# Patient Record
Sex: Male | Born: 1955 | Race: White | Hispanic: No | Marital: Married | Smoking: Never smoker
Health system: Southern US, Community
[De-identification: ages and names within clinical notes are randomized; demographics above are authoritative.]

## PROBLEM LIST (undated history)

## (undated) DIAGNOSIS — G4730 Sleep apnea, unspecified: Secondary | ICD-10-CM

## (undated) DIAGNOSIS — N200 Calculus of kidney: Secondary | ICD-10-CM

## (undated) DIAGNOSIS — E7800 Pure hypercholesterolemia, unspecified: Secondary | ICD-10-CM

## (undated) DIAGNOSIS — I10 Essential (primary) hypertension: Secondary | ICD-10-CM

## (undated) HISTORY — PX: HERNIA REPAIR: SHX51

## (undated) HISTORY — PX: TONSILLECTOMY: SUR1361

## (undated) HISTORY — PX: KIDNEY STONE SURGERY: SHX686

## (undated) HISTORY — PX: CHOLECYSTECTOMY: SHX55

---

## 2008-12-08 ENCOUNTER — Ambulatory Visit: Payer: Self-pay | Admitting: Family Medicine

## 2008-12-08 DIAGNOSIS — Z87442 Personal history of urinary calculi: Secondary | ICD-10-CM | POA: Insufficient documentation

## 2008-12-08 DIAGNOSIS — L255 Unspecified contact dermatitis due to plants, except food: Secondary | ICD-10-CM | POA: Insufficient documentation

## 2008-12-08 DIAGNOSIS — E785 Hyperlipidemia, unspecified: Secondary | ICD-10-CM | POA: Insufficient documentation

## 2008-12-08 DIAGNOSIS — I10 Essential (primary) hypertension: Secondary | ICD-10-CM | POA: Insufficient documentation

## 2010-11-25 ENCOUNTER — Encounter: Payer: Self-pay | Admitting: Emergency Medicine

## 2010-11-25 ENCOUNTER — Emergency Department (HOSPITAL_COMMUNITY): Payer: Worker's Compensation

## 2010-11-25 ENCOUNTER — Other Ambulatory Visit: Payer: Self-pay

## 2010-11-25 ENCOUNTER — Observation Stay (HOSPITAL_COMMUNITY)
Admission: EM | Admit: 2010-11-25 | Discharge: 2010-11-26 | Disposition: A | Discharge status: Home / Self Care | Payer: Worker's Compensation | Attending: Internal Medicine | Admitting: Internal Medicine

## 2010-11-25 DIAGNOSIS — E785 Hyperlipidemia, unspecified: Secondary | ICD-10-CM | POA: Diagnosis present

## 2010-11-25 DIAGNOSIS — I10 Essential (primary) hypertension: Secondary | ICD-10-CM | POA: Diagnosis present

## 2010-11-25 DIAGNOSIS — Z8249 Family history of ischemic heart disease and other diseases of the circulatory system: Secondary | ICD-10-CM | POA: Insufficient documentation

## 2010-11-25 DIAGNOSIS — K219 Gastro-esophageal reflux disease without esophagitis: Secondary | ICD-10-CM | POA: Insufficient documentation

## 2010-11-25 DIAGNOSIS — R0602 Shortness of breath: Secondary | ICD-10-CM | POA: Insufficient documentation

## 2010-11-25 DIAGNOSIS — Z79899 Other long term (current) drug therapy: Secondary | ICD-10-CM | POA: Insufficient documentation

## 2010-11-25 DIAGNOSIS — G4733 Obstructive sleep apnea (adult) (pediatric): Secondary | ICD-10-CM | POA: Diagnosis present

## 2010-11-25 DIAGNOSIS — R079 Chest pain, unspecified: Principal | ICD-10-CM | POA: Diagnosis present

## 2010-11-25 HISTORY — DX: Pure hypercholesterolemia, unspecified: E78.00

## 2010-11-25 HISTORY — DX: Calculus of kidney: N20.0

## 2010-11-25 HISTORY — DX: Sleep apnea, unspecified: G47.30

## 2010-11-25 HISTORY — DX: Essential (primary) hypertension: I10

## 2010-11-25 LAB — CK TOTAL AND CKMB (NOT AT ARMC): Relative Index: 1.9 (ref 0.0–2.5)

## 2010-11-25 LAB — BASIC METABOLIC PANEL
BUN: 14 mg/dL (ref 6–23)
CO2: 27 mEq/L (ref 19–32)
Calcium: 9 mg/dL (ref 8.4–10.5)
Chloride: 104 mEq/L (ref 96–112)
Creatinine, Ser: 0.96 mg/dL (ref 0.50–1.35)

## 2010-11-25 LAB — CBC
HCT: 38.8 % — ABNORMAL LOW (ref 39.0–52.0)
MCH: 30.9 pg (ref 26.0–34.0)
MCV: 89.4 fL (ref 78.0–100.0)
Platelets: 220 10*3/uL (ref 150–400)
RBC: 4.34 MIL/uL (ref 4.22–5.81)
RDW: 12.9 % (ref 11.5–15.5)

## 2010-11-25 LAB — TROPONIN I: Troponin I: <0.3 ng/mL (ref <0.30)

## 2010-11-25 MED ORDER — LISINOPRIL 10 MG PO TABS
20.0000 mg | ORAL_TABLET | Freq: Every day | ORAL | Status: DC
  Administered 2010-11-26: 20 mg via ORAL
  Filled 2010-11-25: qty 2

## 2010-11-25 MED ORDER — NON FORMULARY: 20.0000 mg | Freq: Every day | Status: DC

## 2010-11-25 MED ORDER — ACETAMINOPHEN 325 MG PO TABS: 650.0000 mg | ORAL_TABLET | Freq: Four times a day (QID) | ORAL | Status: DC | PRN

## 2010-11-25 MED ORDER — FLEET ENEMA 7-19 GM/118ML RE ENEM: 1.0000 | ENEMA | Freq: Every day | RECTAL | Status: DC | PRN

## 2010-11-25 MED ORDER — VENLAFAXINE HCL ER 75 MG PO CP24
75.0000 mg | ORAL_CAPSULE | Freq: Every day | ORAL | Status: DC
  Administered 2010-11-26: 75 mg via ORAL
  Filled 2010-11-25: qty 1

## 2010-11-25 MED ORDER — NITROGLYCERIN 0.4 MG SL SUBL
0.4000 mg | SUBLINGUAL_TABLET | SUBLINGUAL | Status: DC | PRN
  Administered 2010-11-25: 0.4 mg via SUBLINGUAL
  Filled 2010-11-25: qty 1

## 2010-11-25 MED ORDER — SODIUM CHLORIDE 0.9 % IJ SOLN
3.0000 mL | Freq: Two times a day (BID) | INTRAMUSCULAR | Status: DC
  Administered 2010-11-25 – 2010-11-26 (×2): 3 mL via INTRAVENOUS
  Filled 2010-11-25 (×2): qty 3

## 2010-11-25 MED ORDER — ONDANSETRON HCL 4 MG/2ML IJ SOLN: 4.0000 mg | Freq: Four times a day (QID) | INTRAMUSCULAR | Status: DC | PRN

## 2010-11-25 MED ORDER — PROMETHAZINE HCL 12.5 MG PO TABS: 12.5000 mg | ORAL_TABLET | Freq: Four times a day (QID) | ORAL | Status: DC | PRN

## 2010-11-25 MED ORDER — ALBUTEROL SULFATE (2.5 MG/3ML) 0.083% IN NEBU: 2.5000 mg | INHALATION_SOLUTION | RESPIRATORY_TRACT | Status: DC | PRN

## 2010-11-25 MED ORDER — ASPIRIN 325 MG PO TABS
325.0000 mg | ORAL_TABLET | ORAL | Status: AC
  Administered 2010-11-25: 325 mg via ORAL
  Filled 2010-11-25: qty 1

## 2010-11-25 MED ORDER — ATORVASTATIN CALCIUM 10 MG PO TABS
20.0000 mg | ORAL_TABLET | Freq: Every day | ORAL | Status: DC
  Filled 2010-11-25: qty 2

## 2010-11-25 MED ORDER — ACETAMINOPHEN 650 MG RE SUPP: 650.0000 mg | Freq: Four times a day (QID) | RECTAL | Status: DC | PRN

## 2010-11-25 MED ORDER — PANTOPRAZOLE SODIUM 40 MG PO TBEC
40.0000 mg | DELAYED_RELEASE_TABLET | Freq: Every day | ORAL | Status: DC
  Administered 2010-11-25 – 2010-11-26 (×2): 40 mg via ORAL
  Filled 2010-11-25 (×2): qty 1

## 2010-11-25 MED ORDER — SENNA 8.6 MG PO TABS: 2.0000 | ORAL_TABLET | Freq: Every day | ORAL | Status: DC | PRN

## 2010-11-25 MED ORDER — ONDANSETRON HCL 4 MG PO TABS: 4.0000 mg | ORAL_TABLET | Freq: Four times a day (QID) | ORAL | Status: DC | PRN

## 2010-11-25 MED ORDER — ALUM & MAG HYDROXIDE-SIMETH 200-200-20 MG/5ML PO SUSP: 30.0000 mL | Freq: Four times a day (QID) | ORAL | Status: DC | PRN

## 2010-11-25 MED ORDER — PROMETHAZINE HCL 25 MG/ML IJ SOLN: 12.5000 mg | Freq: Four times a day (QID) | INTRAMUSCULAR | Status: DC | PRN

## 2010-11-25 MED ORDER — ENOXAPARIN SODIUM 40 MG/0.4ML ~~LOC~~ SOLN
40.0000 mg | Freq: Every day | SUBCUTANEOUS | Status: DC
  Administered 2010-11-25: 40 mg via SUBCUTANEOUS
  Filled 2010-11-25 (×2): qty 0.4

## 2010-11-25 NOTE — H&P (Signed)
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 He is associated with [employer]. Samuel Petersen has also been seen by [facility] cardiology in the past.   Samuel Petersen is an 55 y.o. male.    Chief Complaint: Chest pain  HPI: Samuel Petersen is a 55 year old Caucasian male with a past medical history of hypertension, hyperlipidemia, sleep apnea, who was in his usual state of health earlier today when he was getting off of work and he had the onset of shortness of breath. He felt cold, clammy, had some sweating.  The sensation lasted about 30 minutes. It should be noted that the patient was not working at that time. He had done some mild physically intensive work earlier in the day. He was actually repairing fences. Subsequent to this when patient was driving back home he had retrosternal chest pressure. The pain was 2 of 10 in intensity. It seemed to radiate to the back. It lasted just a few minutes. He was not associate with any nausea, vomiting, however, he does have some dizziness and lightheadedness. By the time he came to the ED the pain had resolved. He denies any cough, fever. Denies any syncopal episodes. He said he was working outside in a hot and humid conditions. Denies any leg swelling.  He does tell me that he was recently diagnosed with [facility] spotted fever and finished the course of his doxycycline 5 days ago.   Patient also tells me that he had the similar complaints back in 2004 which was associate with high blood pressure. He underwent a stress test, which was done by Samuel Petersen cardiology. This apparently revealed some LVH. Patient was put on ACE inhibitors at that time. Currently patient is chest pain free and is sitting comfortably on the bed.   Prior to Admission medications   Medication Sig Start Date End Date Taking? Authorizing Provider  aspirin 81 MG tablet Take 81 mg by mouth daily.     Yes Historical Provider, MD  atorvastatin (LIPITOR) 20  MG tablet Take 20 mg by mouth daily.     Yes Historical Provider, MD  lisinopril (PRINIVIL,ZESTRIL) 20 MG tablet Take 20 mg by mouth daily.     Yes Historical Provider, MD  omeprazole (PRILOSEC) 20 MG capsule Take 20 mg by mouth every other day.     Yes Historical Provider, MD  venlafaxine (EFFEXOR-XR) 75 MG 24 hr capsule Take 75 mg by mouth daily.     Yes Historical Provider, MD  venlafaxine (EFFEXOR) 75 MG tablet Take 75 mg by mouth 2 (two) times daily.   11/25/10 Yes Historical Provider, MD    Allergies: No Known Allergies  Past Medical History  Diagnosis Date  . Hypertension   . Sleep apnea   . High cholesterol   . Kidney stone     Past Surgical History  Procedure Date  . Hernia repair   . Cholecystectomy   . Tonsillectomy   . Kidney stone surgery     Social History:  reports that he has never smoked. He does not have any smokeless tobacco history on file. He reports that he does not drink alcohol or use illicit drugs.  Family History:  Family History  Problem Relation Age of Onset  . Other Father   . Coronary artery disease Father   . Heart attack Father   . Hyperlipidemia Brother   . Hypertension Brother   . Sarcoidosis Brother    Review of Systems  Constitutional: Positive for  diaphoresis. Negative for fever, chills, weight loss and malaise/fatigue.  HENT: Negative for hearing loss.   Eyes: Negative for blurred vision.  Respiratory: Negative for cough and wheezing.   Cardiovascular: Negative for palpitations and leg swelling.  Gastrointestinal: Positive for heartburn.  Genitourinary: Negative.   Musculoskeletal: Negative.   Skin: Negative for itching and rash.  Neurological: Negative.  Negative for headaches.  Endo/Heme/Allergies: Negative.   Psychiatric/Behavioral: Negative.   All other systems reviewed and are negative.    Blood pressure 122/75, pulse 78, temperature 98.5 F (36.9 C), temperature source Oral, resp. rate 20, height 5\' 10"  (1.778 m), weight  100.699 kg (222 lb), SpO2 100.00%. Physical Exam  Vitals reviewed. Constitutional: He is oriented to person, place, and time. He appears well-developed and well-nourished. No distress.  HENT:  Head: Normocephalic and atraumatic.  Eyes: Conjunctivae are normal. Pupils are equal, round, and reactive to light. No scleral icterus.  Neck: Normal range of motion. Neck supple. No JVD present. No thyromegaly present.  Cardiovascular: Normal rate, regular rhythm, normal heart sounds and intact distal pulses.  Exam reveals no gallop and no friction rub.   No murmur heard. Pulmonary/Chest: Effort normal and breath sounds normal. No respiratory distress. He has no wheezes. He has no rales. He exhibits no tenderness.  Abdominal: Soft. He exhibits no distension. There is no tenderness.  Musculoskeletal: Normal range of motion. He exhibits no edema.  Lymphadenopathy:    He has no cervical adenopathy.  Neurological: He is alert and oriented to person, place, and time. No cranial nerve deficit. Coordination normal.  Skin: Skin is warm and dry. He is not diaphoretic.  Psychiatric: He has a normal mood and affect.    Results for orders placed during the hospital encounter of 11/25/10 (from the past 48 hour(s))  CBC     Status: Abnormal   Collection Time   11/25/10  5:23 PM      Component Value Range Comment   WBC 7.3  4.0 - 10.5 (K/uL)    RBC 4.34  4.22 - 5.81 (MIL/uL)    Hemoglobin 13.4  13.0 - 17.0 (g/dL)    HCT 04.5 (*) 40.9 - 52.0 (%)    MCV 89.4  78.0 - 100.0 (fL)    MCH 30.9  26.0 - 34.0 (pg)    MCHC 34.5  30.0 - 36.0 (g/dL)    RDW 81.1  91.4 - 78.2 (%)    Platelets 220  150 - 400 (K/uL)   BASIC METABOLIC PANEL     Status: Abnormal   Collection Time   11/25/10  5:23 PM      Component Value Range Comment   Sodium 138  135 - 145 (mEq/L)    Potassium 3.9  3.5 - 5.1 (mEq/L)    Chloride 104  96 - 112 (mEq/L)    CO2 27  19 - 32 (mEq/L)    Glucose, Bld 110 (*) 70 - 99 (mg/dL)    BUN 14  6 - 23  (mg/dL)    Creatinine, Ser 9.56  0.50 - 1.35 (mg/dL) **Please note change in reference range.**   Calcium 9.0  8.4 - 10.5 (mg/dL)    GFR calc non Af Amer >60  >60 (mL/min)    GFR calc Af Amer >60  >60 (mL/min)   TROPONIN I     Status: Normal   Collection Time   11/25/10  5:23 PM      Component Value Range Comment   Troponin I <0.30  <0.30 (ng/mL)  CK TOTAL AND CKMB     Status: Normal   Collection Time   11/25/10  5:23 PM      Component Value Range Comment   Total CK 125  7 - 232 (U/L)    CK, MB 2.4  0.3 - 4.0 (ng/mL)    Relative Index 1.9  0.0 - 2.5     No results found.  EKG: Shows normal sinus rhythm at 80 beats per minute. Normal axis. Intervals appear to be in the normal range. No Q waves are seen. No concerning ST segment changes are noted.  T inversion is noted in lead V1. Possible criteria for LVH is seen. No older EKG available at this time.  Chest x-ray did not show any active disease.  Assessment/Plan  Principal Problem:  *Chest pain Active Problems:  HYPERLIPIDEMIA  HYPERTENSION  OSA on CPAP   So, this is a 55 year old, Caucasian male with a past medical history of hypertension, hyperlipidemia, sleep apnea, who presents with chest pain, and shortness of breath. His symptoms have resolved at this time. Patient has risk factors for cardiac disease in the form of hypertension, hyperlipidemia, and sleep apnea. Differential diagnosis for chest pain include musculoskeletal, GERD, angina.  Plan:  #1 chest pain: He'll be observed in the hospital and ruled out for acute coronary syndrome. He'll be given aspirin. Continue with his other medications. He'll monitor on telemetry. EKG will be repeated in the morning. Fasting lipid panel will be checked.  #2 hypertension. Continue with lisinopril.  #3 hyperlipidemia. Continue with Lipitor.  #4 sleep apnea continue with CPAP.  #5 DVT prophylaxis will be initiated.  Patient is a full code.  Further management decisions will  depend on results of further testing and patient's response to treatment.   Leeza Heiner 11/25/2010, 8:33 PM

## 2010-11-25 NOTE — ED Notes (Signed)
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 Patient states "When I went back inside to check my email. I started to have a little shortness of breath and dizziness. When I got in my car today I had some cold chills and pain in the center of my chest." It's gone away but I thought I should come and get checked out." Patient denies any chest pain, shortness of breath, dizziness, nausea, or vomiting. Airway intact. Respirations even and non labored. Lung sounds clear. O2 sat 97% on room air. No acute distress noted. Cardiac monitor applied. EKG done.

## 2010-11-25 NOTE — ED Notes (Addendum)
Patient resting quietly in bed. Airway patent. Respirations even and nonlabored. No acute distress noted. No verbalized requests given. Patient and family aware of possible observation admission.

## 2010-11-25 NOTE — ED Provider Notes (Addendum)
History     Chief Complaint  Patient presents with  . Chest Pain  . Shortness of Breath  . Dizziness   Patient is a 55 y.o. male presenting with chest pain and shortness of breath. The history is provided by the patient.  Chest Pain The chest pain began 3 - 5 hours ago. Chest pain occurs constantly. The chest pain is unchanged. Associated with: nothing. The severity of the pain is moderate. The quality of the pain is described as aching. The pain does not radiate. Exacerbated by: nothing. Primary symptoms include shortness of breath. Pertinent negatives for primary symptoms include no fever and no abdominal pain.  Pertinent negatives for associated symptoms include no diaphoresis and no near-syncope. He tried nothing for the symptoms. Risk factors include male gender.  His past medical history is significant for hyperlipidemia and hypertension.  His family medical history is significant for CAD in family.    Shortness of Breath  Associated symptoms include chest pain and shortness of breath. Pertinent negatives include no fever.    Past Medical History  Diagnosis Date  . Hypertension   . Sleep apnea   . High cholesterol   . Kidney stone     Past Surgical History  Procedure Date  . Hernia repair   . Cholecystectomy   . Tonsillectomy   . Kidney stone surgery     Family History  Problem Relation Age of Onset  . Other Father   . Hyperlipidemia Brother   . Hypertension Brother     History  Substance Use Topics  . Smoking status: Never Smoker   . Smokeless tobacco: Not on file  . Alcohol Use: No      Review of Systems  Constitutional: Negative for fever, chills and diaphoresis.  HENT: Negative for neck pain and neck stiffness.   Eyes: Negative for pain.  Respiratory: Positive for shortness of breath.   Cardiovascular: Positive for chest pain. Negative for near-syncope.  Gastrointestinal: Negative for abdominal pain.  Genitourinary: Negative for dysuria.    Musculoskeletal: Negative for back pain.  Skin: Negative for rash.  Neurological: Negative for headaches.  All other systems reviewed and are negative.    Physical Exam  BP 144/81  Pulse 90  Temp(Src) 98.5 F (36.9 C) (Oral)  Resp 20  Ht 5\' 10"  (1.778 m)  Wt 215 lb (97.523 kg)  BMI 30.85 kg/m2  SpO2 97%  Physical Exam  Constitutional: He is oriented to person, place, and time. He appears well-developed and well-nourished.  HENT:  Head: Normocephalic and atraumatic.  Eyes: Conjunctivae and EOM are normal. Pupils are equal, round, and reactive to light.  Neck: Trachea normal. Neck supple. No thyromegaly present.  Cardiovascular: Normal rate, regular rhythm, S1 normal, S2 normal and normal pulses.     No systolic murmur is present   No diastolic murmur is present  Pulses:      Radial pulses are 2+ on the right side, and 2+ on the left side.  Pulmonary/Chest: Effort normal and breath sounds normal. He has no wheezes. He has no rhonchi. He has no rales. He exhibits no tenderness.  Abdominal: Soft. Normal appearance and bowel sounds are normal. There is no tenderness. There is no CVA tenderness and negative Murphy's sign.  Musculoskeletal:       BLE:s Calves nontender, no cords or erythema, negative Homans sign  Neurological: He is alert and oriented to person, place, and time. He has normal strength. No cranial nerve deficit or sensory deficit. GCS  eye subscore is 4. GCS verbal subscore is 5. GCS motor subscore is 6.  Skin: Skin is warm and dry. No rash noted. He is not diaphoretic.  Psychiatric: His speech is normal.       Cooperative and appropriate    ED Course  Procedures   Date: 11/25/2010  Rate: 88  Rhythm: normal sinus rhythm  QRS Axis: normal  Intervals: normal  ST/T Wave abnormalities: nonspecific ST changes and nonspecific ST/T changes  Conduction Disutrbances:none and normal QRS  Narrative Interpretation:   Old EKG Reviewed: none available   MDM CP in 55  y/o make with HTN. HLD and FH MI in father at age 56. CP now resolved. Stat ECG reviewed. Possible ACS, no symptoms or exam findings to suggest dissection or PE and doubt the same. ASA, IV, monitor, O2, CXR, labs, and serial evaluations 5:17 PM   MED c/s for admit CP    Sunnie Nielsen, MD 11/25/10 1610  Sunnie Nielsen, MD 01/23/11 [phone]

## 2010-11-26 ENCOUNTER — Other Ambulatory Visit: Payer: Self-pay

## 2010-11-26 LAB — COMPREHENSIVE METABOLIC PANEL
ALT: 19 U/L (ref 0–53)
AST: 18 U/L (ref 0–37)
Albumin: 3.6 g/dL (ref 3.5–5.2)
Alkaline Phosphatase: 76 U/L (ref 39–117)
Calcium: 9.1 mg/dL (ref 8.4–10.5)
Potassium: 4 mEq/L (ref 3.5–5.1)
Sodium: 138 mEq/L (ref 135–145)
Total Protein: 6.9 g/dL (ref 6.0–8.3)

## 2010-11-26 LAB — LIPID PANEL
LDL Cholesterol: 72 mg/dL (ref 0–99)
VLDL: 24 mg/dL (ref 0–40)

## 2010-11-26 LAB — CBC
HCT: 39.3 % (ref 39.0–52.0)
Hemoglobin: 13.5 g/dL (ref 13.0–17.0)
MCHC: 34.4 g/dL (ref 30.0–36.0)
MCV: 90.1 fL (ref 78.0–100.0)
RDW: 12.9 % (ref 11.5–15.5)

## 2010-11-26 LAB — CARDIAC PANEL(CRET KIN+CKTOT+MB+TROPI)
CK, MB: 2.1 ng/mL (ref 0.3–4.0)
CK, MB: 2.2 ng/mL (ref 0.3–4.0)
Relative Index: 1.9 (ref 0.0–2.5)
Troponin I: <0.3 ng/mL (ref <0.30)

## 2010-11-26 NOTE — Discharge Summary (Signed)
  Physician Discharge Summary  Patient ID: Samuel Petersen MRN: [id number] DOB/AGE: [birth year] 53 y.o.  Admit date: 11/25/2010 Discharge date: 11/26/2010  Primary Care Physician:  Almond Lint, MD   Discharge Diagnoses:    Patient Active Problem List  Diagnoses  . HYPERLIPIDEMIA  . HYPERTENSION  . RHUS DERMATITIS  . NEPHROLITHIASIS, HX OF  . Chest pain  . OSA on CPAP  . GERD (gastroesophageal reflux disease)    Current Discharge Medication List    CONTINUE these medications which have NOT CHANGED   Details  aspirin 81 MG tablet Take 81 mg by mouth daily.      atorvastatin (LIPITOR) 20 MG tablet Take 20 mg by mouth daily.      ibuprofen (ADVIL) 200 MG tablet Take 200 mg by mouth every 6 (six) hours as needed.      lisinopril (PRINIVIL,ZESTRIL) 20 MG tablet Take 20 mg by mouth daily.      omeprazole (PRILOSEC) 20 MG capsule Take 20 mg by mouth every other day.      venlafaxine (EFFEXOR-XR) 75 MG 24 hr capsule Take 75 mg by mouth daily.           Disposition and Follow-up: Improved. Discharge diet low sodium Activity slowly increase Follow with PCP as scheduled in 2 months Discharge to home  Consults:  none   Significant Diagnostic Studies:  EKG reviewed noting normal sinus rhythm. Cardiac markers cycled x3: All within normal limits Chest x-ray unremarkable.    Hospital Course:  Principal Problem:  *Chest pain: 55 year old white male with past medical history of hypertension, hyperlipidemia (both well controlled) presented with atypical chest discomfort described as a mild pressure midsternal exacerbated by movement along with mild shortness of breath. He also noted to have a family history with a father who died of a heart attack at age 13. Given these findings patient was admitted as observation and watched overnight. His chest x-ray and EKG on admission were unremarkable he had cardiac enzymes x3 which all were negative. After examination and the fact that  his chest pain had fully resolved I felt that his chest pain was not indicative of ACS and more likely a combination of musculoskeletal with some shortness of breath which was caused by his heavy exertion during the hot weather. Active Problems:  HYPERLIPIDEMIA-stable continue medication  HYPERTENSION-stable continue medication  OSA on CPAP    Signed: Hollice Espy 11/26/2010, 2:44 PM

## 2010-11-26 NOTE — Progress Notes (Signed)
D/C to home in stable condition, verbalized understanding of d/c instructions. 11/26/2010 "[place]

## 2011-01-08 NOTE — Progress Notes (Signed)
Encounter addended by: Karen Kays on: 01/08/2011  3:22 PM<BR>     Documentation filed: Flowsheet VN

## 2011-01-08 NOTE — Progress Notes (Signed)
Encounter addended by: Karen Kays on: 01/08/2011  3:19 PM<BR>     Documentation filed: Flowsheet VN

## 2011-01-15 NOTE — Progress Notes (Signed)
Encounter addended by: Karen Kays on: 01/15/2011  8:58 AM<BR>     Documentation filed: Flowsheet VN

## 2011-01-23 NOTE — Progress Notes (Signed)
Encounter addended by: Sunnie Nielsen, MD on: 01/23/2011  3:28 AM<BR>     Documentation filed: Charting, Inpatient Notes

## 2011-01-24 NOTE — Progress Notes (Signed)
Encounter addended by: Karen Kays on: 01/24/2011  9:59 AM<BR>     Documentation filed: Flowsheet VN

## 2011-04-03 ENCOUNTER — Encounter (INDEPENDENT_AMBULATORY_CARE_PROVIDER_SITE_OTHER): Payer: 59 | Admitting: Ophthalmology

## 2011-04-03 DIAGNOSIS — H43819 Vitreous degeneration, unspecified eye: Secondary | ICD-10-CM

## 2011-04-03 DIAGNOSIS — H2510 Age-related nuclear cataract, unspecified eye: Secondary | ICD-10-CM

## 2011-04-03 DIAGNOSIS — H35039 Hypertensive retinopathy, unspecified eye: Secondary | ICD-10-CM

## 2011-04-03 DIAGNOSIS — I10 Essential (primary) hypertension: Secondary | ICD-10-CM

## 2013-02-16 IMAGING — CR DG CHEST 1V
1 series · 1 of 1 positions shown · non-contrast
Comparison: None

CLINICAL DATA: Chest pain and shortness of breath

CHEST - 1 VIEW

[view not recorded]
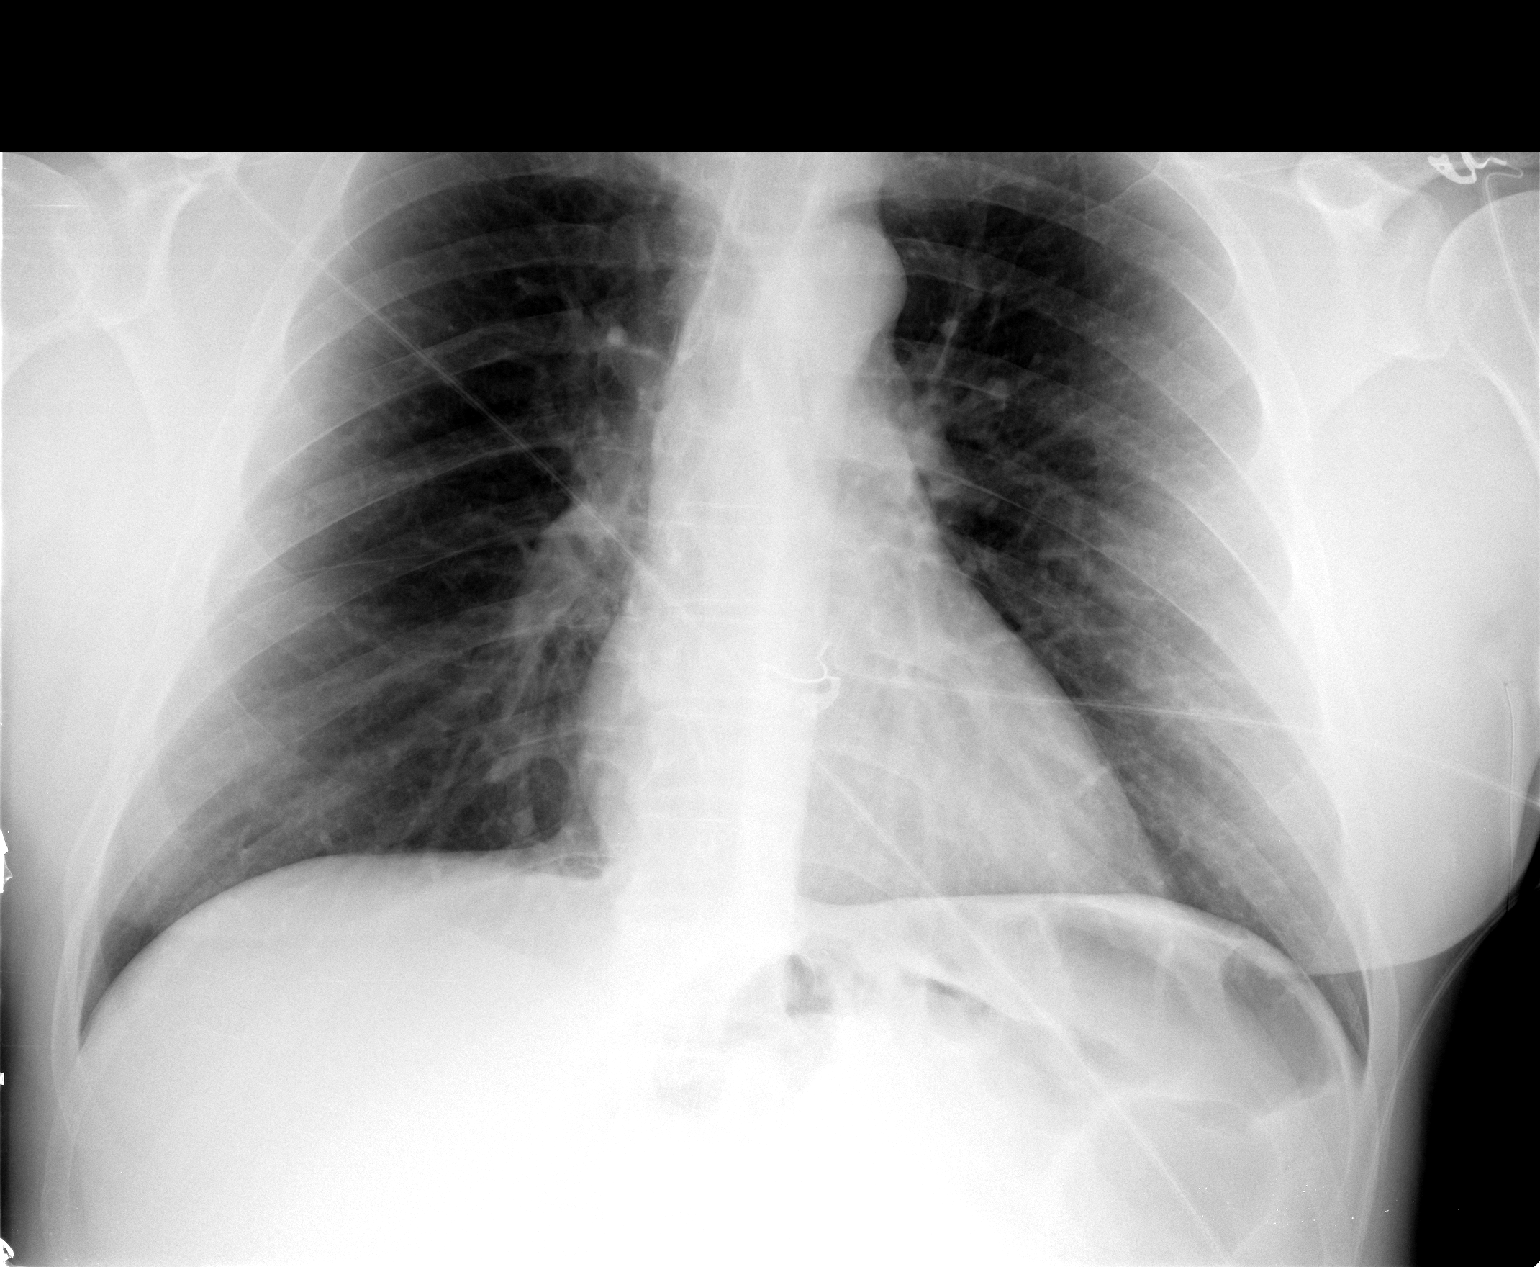

[1 of 1 positions shown; findings below may reference images not displayed]

FINDINGS: The heart size and mediastinal contours are within normal
limits.  Both lungs are clear.  The visualized skeletal structures
are unremarkable.
IMPRESSION: No active disease.

## 2016-07-14 DIAGNOSIS — H31091 Other chorioretinal scars, right eye: Secondary | ICD-10-CM | POA: Diagnosis not present

## 2016-08-06 DIAGNOSIS — E119 Type 2 diabetes mellitus without complications: Secondary | ICD-10-CM | POA: Diagnosis not present

## 2016-08-06 DIAGNOSIS — E782 Mixed hyperlipidemia: Secondary | ICD-10-CM | POA: Diagnosis not present

## 2016-08-09 DIAGNOSIS — Z23 Encounter for immunization: Secondary | ICD-10-CM | POA: Diagnosis not present

## 2016-08-09 DIAGNOSIS — Z0001 Encounter for general adult medical examination with abnormal findings: Secondary | ICD-10-CM | POA: Diagnosis not present

## 2016-12-30 DIAGNOSIS — G4733 Obstructive sleep apnea (adult) (pediatric): Secondary | ICD-10-CM | POA: Diagnosis not present

## 2017-01-30 DIAGNOSIS — E782 Mixed hyperlipidemia: Secondary | ICD-10-CM | POA: Diagnosis not present

## 2017-01-30 DIAGNOSIS — E291 Testicular hypofunction: Secondary | ICD-10-CM | POA: Diagnosis not present

## 2017-01-30 DIAGNOSIS — I10 Essential (primary) hypertension: Secondary | ICD-10-CM | POA: Diagnosis not present

## 2017-01-30 DIAGNOSIS — E119 Type 2 diabetes mellitus without complications: Secondary | ICD-10-CM | POA: Diagnosis not present

## 2017-02-04 DIAGNOSIS — E782 Mixed hyperlipidemia: Secondary | ICD-10-CM | POA: Diagnosis not present

## 2017-02-04 DIAGNOSIS — G4733 Obstructive sleep apnea (adult) (pediatric): Secondary | ICD-10-CM | POA: Diagnosis not present

## 2017-02-04 DIAGNOSIS — Z23 Encounter for immunization: Secondary | ICD-10-CM | POA: Diagnosis not present

## 2017-06-02 DIAGNOSIS — Z0000 Encounter for general adult medical examination without abnormal findings: Secondary | ICD-10-CM | POA: Diagnosis not present

## 2017-08-07 DIAGNOSIS — Z0001 Encounter for general adult medical examination with abnormal findings: Secondary | ICD-10-CM | POA: Diagnosis not present

## 2017-08-13 DIAGNOSIS — I10 Essential (primary) hypertension: Secondary | ICD-10-CM | POA: Diagnosis not present

## 2017-08-13 DIAGNOSIS — Z0001 Encounter for general adult medical examination with abnormal findings: Secondary | ICD-10-CM | POA: Diagnosis not present

## 2017-08-13 DIAGNOSIS — R7301 Impaired fasting glucose: Secondary | ICD-10-CM | POA: Diagnosis not present

## 2017-08-13 DIAGNOSIS — E782 Mixed hyperlipidemia: Secondary | ICD-10-CM | POA: Diagnosis not present

## 2017-08-27 DIAGNOSIS — Z9189 Other specified personal risk factors, not elsewhere classified: Secondary | ICD-10-CM | POA: Diagnosis not present

## 2017-08-27 DIAGNOSIS — E119 Type 2 diabetes mellitus without complications: Secondary | ICD-10-CM | POA: Diagnosis not present

## 2017-08-27 DIAGNOSIS — K219 Gastro-esophageal reflux disease without esophagitis: Secondary | ICD-10-CM | POA: Diagnosis not present

## 2017-08-27 DIAGNOSIS — I10 Essential (primary) hypertension: Secondary | ICD-10-CM | POA: Diagnosis not present

## 2017-09-02 DIAGNOSIS — R195 Other fecal abnormalities: Secondary | ICD-10-CM | POA: Diagnosis not present

## 2017-09-08 DIAGNOSIS — S40862A Insect bite (nonvenomous) of left upper arm, initial encounter: Secondary | ICD-10-CM | POA: Diagnosis not present

## 2017-09-08 DIAGNOSIS — W57XXXA Bitten or stung by nonvenomous insect and other nonvenomous arthropods, initial encounter: Secondary | ICD-10-CM | POA: Diagnosis not present

## 2017-09-08 DIAGNOSIS — S30860A Insect bite (nonvenomous) of lower back and pelvis, initial encounter: Secondary | ICD-10-CM | POA: Diagnosis not present

## 2017-09-14 DIAGNOSIS — Z8371 Family history of colonic polyps: Secondary | ICD-10-CM | POA: Diagnosis not present

## 2017-09-14 DIAGNOSIS — R195 Other fecal abnormalities: Secondary | ICD-10-CM | POA: Diagnosis not present

## 2017-09-14 DIAGNOSIS — K5900 Constipation, unspecified: Secondary | ICD-10-CM | POA: Diagnosis not present

## 2017-09-24 DIAGNOSIS — E782 Mixed hyperlipidemia: Secondary | ICD-10-CM | POA: Diagnosis not present

## 2017-09-24 DIAGNOSIS — I10 Essential (primary) hypertension: Secondary | ICD-10-CM | POA: Diagnosis not present

## 2017-09-24 DIAGNOSIS — K219 Gastro-esophageal reflux disease without esophagitis: Secondary | ICD-10-CM | POA: Diagnosis not present

## 2017-09-24 DIAGNOSIS — Z9189 Other specified personal risk factors, not elsewhere classified: Secondary | ICD-10-CM | POA: Diagnosis not present

## 2017-09-28 DIAGNOSIS — R195 Other fecal abnormalities: Secondary | ICD-10-CM | POA: Diagnosis not present

## 2018-01-16 DIAGNOSIS — Y929 Unspecified place or not applicable: Secondary | ICD-10-CM | POA: Diagnosis not present

## 2018-01-16 DIAGNOSIS — S46811A Strain of other muscles, fascia and tendons at shoulder and upper arm level, right arm, initial encounter: Secondary | ICD-10-CM | POA: Diagnosis not present

## 2018-01-16 DIAGNOSIS — Y999 Unspecified external cause status: Secondary | ICD-10-CM | POA: Diagnosis not present

## 2018-01-17 DIAGNOSIS — I10 Essential (primary) hypertension: Secondary | ICD-10-CM | POA: Diagnosis not present

## 2018-01-17 DIAGNOSIS — M25511 Pain in right shoulder: Secondary | ICD-10-CM | POA: Diagnosis not present

## 2018-01-17 DIAGNOSIS — M62838 Other muscle spasm: Secondary | ICD-10-CM | POA: Diagnosis not present

## 2018-01-21 DIAGNOSIS — M542 Cervicalgia: Secondary | ICD-10-CM | POA: Diagnosis not present

## 2018-01-21 DIAGNOSIS — M25511 Pain in right shoulder: Secondary | ICD-10-CM | POA: Diagnosis not present

## 2018-01-24 DIAGNOSIS — M542 Cervicalgia: Secondary | ICD-10-CM | POA: Diagnosis not present

## 2018-02-10 DIAGNOSIS — R7301 Impaired fasting glucose: Secondary | ICD-10-CM | POA: Diagnosis not present

## 2018-02-10 DIAGNOSIS — Z23 Encounter for immunization: Secondary | ICD-10-CM | POA: Diagnosis not present

## 2018-02-10 DIAGNOSIS — E782 Mixed hyperlipidemia: Secondary | ICD-10-CM | POA: Diagnosis not present

## 2018-02-10 DIAGNOSIS — I10 Essential (primary) hypertension: Secondary | ICD-10-CM | POA: Diagnosis not present

## 2018-02-10 DIAGNOSIS — G4733 Obstructive sleep apnea (adult) (pediatric): Secondary | ICD-10-CM | POA: Diagnosis not present

## 2018-02-10 DIAGNOSIS — K219 Gastro-esophageal reflux disease without esophagitis: Secondary | ICD-10-CM | POA: Diagnosis not present

## 2018-02-23 DIAGNOSIS — M542 Cervicalgia: Secondary | ICD-10-CM | POA: Diagnosis not present

## 2018-02-23 DIAGNOSIS — M47812 Spondylosis without myelopathy or radiculopathy, cervical region: Secondary | ICD-10-CM | POA: Diagnosis not present

## 2018-03-04 DIAGNOSIS — G4733 Obstructive sleep apnea (adult) (pediatric): Secondary | ICD-10-CM | POA: Diagnosis not present

## 2018-08-23 DIAGNOSIS — I10 Essential (primary) hypertension: Secondary | ICD-10-CM | POA: Diagnosis not present

## 2018-08-23 DIAGNOSIS — E119 Type 2 diabetes mellitus without complications: Secondary | ICD-10-CM | POA: Diagnosis not present

## 2018-08-23 DIAGNOSIS — E782 Mixed hyperlipidemia: Secondary | ICD-10-CM | POA: Diagnosis not present

## 2018-08-23 DIAGNOSIS — G4733 Obstructive sleep apnea (adult) (pediatric): Secondary | ICD-10-CM | POA: Diagnosis not present

## 2018-08-26 DIAGNOSIS — G4733 Obstructive sleep apnea (adult) (pediatric): Secondary | ICD-10-CM | POA: Diagnosis not present

## 2018-08-26 DIAGNOSIS — E782 Mixed hyperlipidemia: Secondary | ICD-10-CM | POA: Diagnosis not present

## 2020-03-26 ENCOUNTER — Encounter (INDEPENDENT_AMBULATORY_CARE_PROVIDER_SITE_OTHER): Payer: Self-pay | Admitting: Ophthalmology

## 2020-03-26 ENCOUNTER — Other Ambulatory Visit: Payer: Self-pay

## 2020-03-26 DIAGNOSIS — H43813 Vitreous degeneration, bilateral: Secondary | ICD-10-CM

## 2020-03-26 DIAGNOSIS — I10 Essential (primary) hypertension: Secondary | ICD-10-CM | POA: Diagnosis not present

## 2020-03-26 DIAGNOSIS — H35033 Hypertensive retinopathy, bilateral: Secondary | ICD-10-CM

## 2020-04-03 ENCOUNTER — Encounter (INDEPENDENT_AMBULATORY_CARE_PROVIDER_SITE_OTHER): Payer: Self-pay | Admitting: Ophthalmology

## 2020-04-10 ENCOUNTER — Encounter (INDEPENDENT_AMBULATORY_CARE_PROVIDER_SITE_OTHER): Payer: Self-pay | Admitting: Ophthalmology

## 2020-04-24 ENCOUNTER — Other Ambulatory Visit: Payer: Self-pay

## 2020-04-24 ENCOUNTER — Encounter (INDEPENDENT_AMBULATORY_CARE_PROVIDER_SITE_OTHER): Payer: 59 | Admitting: Ophthalmology

## 2020-04-24 DIAGNOSIS — H35033 Hypertensive retinopathy, bilateral: Secondary | ICD-10-CM

## 2020-04-24 DIAGNOSIS — H43813 Vitreous degeneration, bilateral: Secondary | ICD-10-CM

## 2020-04-24 DIAGNOSIS — H53411 Scotoma involving central area, right eye: Secondary | ICD-10-CM

## 2020-04-24 DIAGNOSIS — H2513 Age-related nuclear cataract, bilateral: Secondary | ICD-10-CM

## 2020-04-24 DIAGNOSIS — I10 Essential (primary) hypertension: Secondary | ICD-10-CM

## 2020-07-23 ENCOUNTER — Encounter (INDEPENDENT_AMBULATORY_CARE_PROVIDER_SITE_OTHER): Payer: 59 | Admitting: Ophthalmology

## 2020-08-13 ENCOUNTER — Encounter (INDEPENDENT_AMBULATORY_CARE_PROVIDER_SITE_OTHER): Payer: 59 | Admitting: Ophthalmology

## 2020-08-13 ENCOUNTER — Other Ambulatory Visit: Payer: Self-pay

## 2020-08-13 DIAGNOSIS — H2513 Age-related nuclear cataract, bilateral: Secondary | ICD-10-CM

## 2020-08-13 DIAGNOSIS — H43813 Vitreous degeneration, bilateral: Secondary | ICD-10-CM

## 2020-08-13 DIAGNOSIS — I10 Essential (primary) hypertension: Secondary | ICD-10-CM | POA: Diagnosis not present

## 2020-08-13 DIAGNOSIS — H53411 Scotoma involving central area, right eye: Secondary | ICD-10-CM | POA: Diagnosis not present

## 2020-08-13 DIAGNOSIS — H35033 Hypertensive retinopathy, bilateral: Secondary | ICD-10-CM | POA: Diagnosis not present

## 2021-02-13 ENCOUNTER — Encounter (INDEPENDENT_AMBULATORY_CARE_PROVIDER_SITE_OTHER): Payer: 59 | Admitting: Ophthalmology

## 2021-02-28 ENCOUNTER — Other Ambulatory Visit: Payer: Self-pay

## 2021-02-28 ENCOUNTER — Encounter (INDEPENDENT_AMBULATORY_CARE_PROVIDER_SITE_OTHER): Payer: 59 | Admitting: Ophthalmology

## 2021-02-28 DIAGNOSIS — H35033 Hypertensive retinopathy, bilateral: Secondary | ICD-10-CM

## 2021-02-28 DIAGNOSIS — H4322 Crystalline deposits in vitreous body, left eye: Secondary | ICD-10-CM

## 2021-02-28 DIAGNOSIS — H43813 Vitreous degeneration, bilateral: Secondary | ICD-10-CM | POA: Diagnosis not present

## 2021-02-28 DIAGNOSIS — I10 Essential (primary) hypertension: Secondary | ICD-10-CM | POA: Diagnosis not present

## 2021-02-28 DIAGNOSIS — H2513 Age-related nuclear cataract, bilateral: Secondary | ICD-10-CM

## 2022-03-24 ENCOUNTER — Other Ambulatory Visit (HOSPITAL_COMMUNITY): Payer: Self-pay | Admitting: Orthopedic Surgery

## 2022-03-24 DIAGNOSIS — Z01818 Encounter for other preprocedural examination: Secondary | ICD-10-CM

## 2022-03-31 ENCOUNTER — Encounter (HOSPITAL_COMMUNITY): Payer: Self-pay

## 2022-03-31 ENCOUNTER — Other Ambulatory Visit: Payer: Self-pay

## 2022-03-31 ENCOUNTER — Emergency Department (HOSPITAL_COMMUNITY)
Admission: EM | Admit: 2022-03-31 | Discharge: 2022-03-31 | Disposition: A | Discharge status: Home / Self Care | Payer: 59 | Attending: Emergency Medicine | Admitting: Emergency Medicine

## 2022-03-31 ENCOUNTER — Emergency Department (HOSPITAL_COMMUNITY): Payer: 59

## 2022-03-31 DIAGNOSIS — R42 Dizziness and giddiness: Secondary | ICD-10-CM | POA: Diagnosis not present

## 2022-03-31 DIAGNOSIS — H7292 Unspecified perforation of tympanic membrane, left ear: Secondary | ICD-10-CM | POA: Insufficient documentation

## 2022-03-31 MED ORDER — CIPROFLOXACIN-DEXAMETHASONE 0.3-0.1 % OT SUSP: 4.0000 [drp] | Freq: Two times a day (BID) | OTIC | 0 refills | Status: DC

## 2022-03-31 NOTE — ED Provider Notes (Signed)
[facility] EMERGENCY DEPARTMENT Provider Note   CSN: [id number] Arrival date & time: 03/31/22  1017     History Chief Complaint  Patient presents with   Dizziness    HPI Samuel Petersen is a 66 y.o. male presenting for dizziness.  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  He immediately had loss of sound out of the last year and dizziness.  Fortunately, dizziness has completely resolved.  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  He denies fevers or chills, nausea vomiting, syncope shortness of breath.  He is up-to-date on his vaccinations including tetanus vaccination.   Patient's recorded medical, surgical, social, medication list and allergies were reviewed in the Snapshot window as part of the initial history.   Review of Systems   Review of Systems  Constitutional:  Negative for chills and fever.  HENT:  Positive for hearing loss. Negative for ear pain and sore throat.   Eyes:  Negative for pain and visual disturbance.  Respiratory:  Negative for cough and shortness of breath.   Cardiovascular:  Negative for chest pain and palpitations.  Gastrointestinal:  Negative for abdominal pain and vomiting.  Genitourinary:  Negative for dysuria and hematuria.  Musculoskeletal:  Negative for arthralgias and back pain.  Skin:  Negative for color change and rash.  Neurological:  Positive for dizziness. Negative for seizures and syncope.  All other systems reviewed and are negative.   Physical Exam Updated Vital Signs BP 119/81   Pulse 62   Temp 98 F (36.7 C) (Oral)   Resp 18   Ht 5' 9.5" (1.765 m)   Wt 88.5 kg   SpO2 96%   BMI 28.38 kg/m  Physical Exam Vitals and nursing note reviewed.  Constitutional:      General: He is not in acute distress.    Appearance: He is well-developed.  HENT:     Head: Normocephalic and atraumatic.     Ears:     Comments: Significant erythema inside the left ear canal.  Significant  cerumen formation.  Tympanic membrane appears perforated. Eyes:     Conjunctiva/sclera: Conjunctivae normal.  Cardiovascular:     Rate and Rhythm: Normal rate and regular rhythm.     Heart sounds: No murmur heard. Pulmonary:     Effort: Pulmonary effort is normal. No respiratory distress.     Breath sounds: Normal breath sounds.  Abdominal:     Palpations: Abdomen is soft.     Tenderness: There is no abdominal tenderness.  Musculoskeletal:        General: No swelling.     Cervical back: Neck supple.  Skin:    General: Skin is warm and dry.     Capillary Refill: Capillary refill takes less than 2 seconds.  Neurological:     Mental Status: He is alert.  Psychiatric:        Mood and Affect: Mood normal.      ED Course/ Medical Decision Making/ A&P    Procedures Procedures   Medications Ordered in ED Medications - No data to display  Medical Decision Making:    Samuel Petersen is a 66 y.o. male who presented to the ED today with left ear injury detailed above.     Patient's presentation is complicated by their history of age, history of trauma in a similar location.  Patient placed on continuous vitals and telemetry monitoring while in ED which was reviewed periodically.  Complete initial physical exam performed, notably the patient  was without any hearing on the left side.  Visual tympanic membrane perforation.      Reviewed and confirmed nursing documentation for past medical history, family history, social history.    Initial Assessment:   Patient's history of present on his physical and findings are concerning for likely left-sided ruptured tympanic membrane.  Patient's description of the event means that he likely extracted the foreign body already, however potential retained foreign body from the incident is a possibility. Visualization is challenging because of habitus, cerumen and degree of irritation in the canal at this time. This is concerning for an acute  potentially life/limb-threatening illness.   Initial Plan:  CT head evaluate for structural intracranial foreign body given mechanism. Radiology  All images reviewed independently. Agree with radiology report at this time.   CT HEAD WO CONTRAST ( )  Result Date: 03/31/2022 CLINICAL DATA:  New onset headache. EXAM: CT HEAD WITHOUT CONTRAST TECHNIQUE: Contiguous axial images were obtained from the base of the skull through the vertex without intravenous contrast. RADIATION DOSE REDUCTION: This exam was performed according to the departmental dose-optimization program which includes automated exposure control, adjustment of the mA and/or kV according to patient size and/or use of iterative reconstruction technique. COMPARISON:  None Available. FINDINGS: Brain: No evidence of intracranial hemorrhage, acute infarction, hydrocephalus, extra-axial collection, or mass lesion/mass effect. Vascular:  No hyperdense vessel or other acute findings. Skull: No evidence of fracture or other significant bone abnormality. Sinuses/Orbits:  No acute findings. Other: None. IMPRESSION: Negative noncontrast head CT. Electronically Signed   By: Danae Orleans M.D.   On: 03/31/2022 11:24     Final Assessment and Plan:   CT scan resulted with no acute pathology.  Given overall well appearance of the patient, stability of symptoms, reassuring objective evaluation, no acute indication for further intervention here in the emergency department. Patient's tetanus is up-to-date.  We will treat patient with Ciprodex for perforated eardrum and recommend close follow-up with specialist in the outpatient setting.   Disposition:  I have considered need for hospitalization, however, considering all of the above, I believe this patient is stable for discharge at this time.  Patient/family educated about specific return precautions for given chief complaint and symptoms.  Patient/family educated about follow-up with PCP.      Patient/family expressed understanding of return precautions and need for follow-up. Patient spoken to regarding all imaging and laboratory results and appropriate follow up for these results. All education provided in verbal form with additional information in written form. Time was allowed for answering of patient questions. Patient discharged.    Emergency Department Medication Summary:   Medications - No data to display       Clinical Impression:  1. Perforation of left tympanic membrane      Discharge   Final Clinical Impression(s) / ED Diagnoses Final diagnoses:  Perforation of left tympanic membrane    Rx / DC Orders ED Discharge Orders          Ordered    Ambulatory referral to ENT        03/31/22 1145    ciprofloxacin-dexamethasone (CIPRODEX) OTIC suspension  2 times daily        03/31/22 1145              Glyn Ade, MD 03/31/22 1147

## 2022-03-31 NOTE — ED Notes (Signed)
Pt currently in CT.

## 2022-03-31 NOTE — ED Triage Notes (Signed)
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

## 2022-04-02 ENCOUNTER — Ambulatory Visit (HOSPITAL_COMMUNITY)
Admission: RE | Admit: 2022-04-02 | Discharge: 2022-04-02 | Disposition: A | Discharge status: Home / Self Care | Payer: 59 | Source: Ambulatory Visit | Attending: Orthopedic Surgery | Admitting: Orthopedic Surgery

## 2022-04-02 DIAGNOSIS — Z01818 Encounter for other preprocedural examination: Secondary | ICD-10-CM | POA: Diagnosis not present

## 2022-04-22 ENCOUNTER — Other Ambulatory Visit (HOSPITAL_COMMUNITY): Payer: Self-pay

## 2022-04-29 ENCOUNTER — Other Ambulatory Visit (HOSPITAL_COMMUNITY): Payer: Self-pay

## 2022-07-11 ENCOUNTER — Ambulatory Visit (INDEPENDENT_AMBULATORY_CARE_PROVIDER_SITE_OTHER): Payer: 59 | Admitting: Pulmonary Disease

## 2022-07-11 ENCOUNTER — Encounter: Payer: Self-pay | Admitting: Pulmonary Disease

## 2022-07-11 VITALS — BP 124/80 | HR 80 | Ht 70.0 in | Wt 196.6 lb

## 2022-07-11 DIAGNOSIS — G4733 Obstructive sleep apnea (adult) (pediatric): Secondary | ICD-10-CM

## 2022-07-11 NOTE — Patient Instructions (Signed)
Will arrange for a home sleep study Will call to arrange for follow up after sleep study reviewed

## 2022-07-11 NOTE — Progress Notes (Signed)
[facility]  Chief Complaint  Patient presents with   Consult    Pt sleep consult has used CPAP since 2008, is just here to establish care. No issues or concerns    Past Surgical History:  He  has a past surgical history that includes Hernia repair; Cholecystectomy; Tonsillectomy; and Kidney stone surgery.  Past Medical History:  HLD, HTN, Nephrolithiasis  Constitutional:  BP 124/80   Pulse 80   Ht '5\' 10"'$  (1.778 m)   Wt 196 lb 9.6 oz (89.2 kg)   SpO2 97%   BMI 28.21 kg/m   Brief Summary:  Samuel Petersen is a 67 y.o. male with obstructive sleep apnea.      Subjective:   He had a sleep study in 2008.  Results not available.  He has been using the same CPAP since then.  Hasn't been in touch with a DME for several years.  He has tried sleeping without CPAP.  After the 2nd or 3rd day he starts waking up feeling choked and then feels tired during the day.  He has been using a full face mask.  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  He was told about the So-Hi device.  His daughter works as a Copywriter, advertising and told him about mouth pieces.  He goes to sleep at 11 pm.  He falls asleep 20 minutes.  He wakes up some times to use the bathroom.  He gets out of bed at 730 am.  He feels okay in the morning.  He denies morning headache.  He does not use anything to help him fall sleep or stay awake.  He denies sleep walking, sleep talking, bruxism, or nightmares.  There is no history of restless legs.  He denies sleep hallucinations, sleep paralysis, or cataplexy.  The Epworth score is 6 out of 24.   Physical Exam:   Appearance - well kempt   ENMT - no sinus tenderness, no oral exudate, no LAN, Mallampati 2 airway, no stridor  Respiratory - equal breath sounds bilaterally, no wheezing or rales  CV - s1s2 regular rate and rhythm, no murmurs  Ext - no clubbing, no edema  Skin - no rashes  Psych - normal mood and affect   Sleep  Tests:    Social History:  He  reports that he has never smoked. He does not have any smokeless tobacco history on file. He reports that he does not drink alcohol and does not use drugs.  Family History:  His family history includes Coronary artery disease in his father; Heart attack in his father; Hyperlipidemia in his brother; Hypertension in his brother; Other in his father; Sarcoidosis in his brother.    Discussion:  He has history of sleep apnea and has been using the same CPAP device since 2008.  He has a history of hypertension.  Will need to assess current status of his sleep apnea, and then determine what is the best treatment option going ahead (continue CPAP, arrange for oral appliance, or assess for Inspire device).  Assessment/Plan:   Obstructive sleep apnea. - will need to arrange for a home sleep study  Cardiovascular risk. - had an extensive discussion regarding the adverse health consequences related to untreated sleep disordered breathing - specifically discussed the risks for hypertension, coronary artery disease, cardiac dysrhythmias, cerebrovascular disease, and diabetes - lifestyle modification discussed  Safe driving practices. - discussed how sleep disruption can increase risk of accidents, particularly when  driving - safe driving practices were discussed  Therapies for obstructive sleep apnea. - if the sleep study shows significant sleep apnea, then various therapies for treatment were reviewed: CPAP, oral appliance, and surgical interventions  Time Spent Involved in Patient Care on Day of Examination:  35 minutes  Follow up:   Patient Instructions  Will arrange for a home sleep study Will call to arrange for follow up after sleep study reviewed   Medication List:   Allergies as of 07/11/2022   No Known Allergies      Medication List        Accurate as of July 11, 2022 10:31 AM. If you have any questions, ask your nurse or doctor.           STOP taking these medications    ciprofloxacin-dexamethasone OTIC suspension Commonly known as: Ciprodex Stopped by: Chesley Mires, MD       TAKE these medications    Advil 200 MG tablet Generic drug: ibuprofen Take 200 mg by mouth every 6 (six) hours as needed.   aspirin 81 MG tablet Take 81 mg by mouth daily.   lisinopril 20 MG tablet Commonly known as: ZESTRIL Take 20 mg by mouth daily.   lovastatin 20 MG tablet Commonly known as: MEVACOR Take 20 mg by mouth at bedtime.   meloxicam 15 MG tablet Commonly known as: MOBIC Take 15 mg by mouth daily.   omeprazole 20 MG capsule Commonly known as: PRILOSEC Take 20 mg by mouth every other day.   venlafaxine XR 75 MG 24 hr capsule Commonly known as: EFFEXOR-XR Take 75 mg by mouth daily.        Signature:  Chesley Mires, MD [facility] Pager - [phone] 07/11/2022, 10:31 AM

## 2022-08-13 ENCOUNTER — Telehealth: Payer: Self-pay | Admitting: Pulmonary Disease

## 2022-08-13 ENCOUNTER — Ambulatory Visit (INDEPENDENT_AMBULATORY_CARE_PROVIDER_SITE_OTHER): Payer: 59

## 2022-08-13 DIAGNOSIS — G4733 Obstructive sleep apnea (adult) (pediatric): Secondary | ICD-10-CM | POA: Diagnosis not present

## 2022-08-13 NOTE — Telephone Encounter (Signed)
HST 08/03/22 >> AHI 15.1, SpO2 low 80%   Please inform him that his sleep study shows moderate obstructive sleep apnea.  Please arrange for ROV with me or NP to discuss treatment options.

## 2022-08-14 NOTE — Telephone Encounter (Signed)
Called patient and he asked that we call him tomorrow Friday 08/15/2022 at 10:00 am will route to [facility] so she can call him tomorrow!

## 2022-08-27 NOTE — Telephone Encounter (Signed)
ATC pt & LVM, he is scheduled w/ VS on 4/12 to discuss results.

## 2022-08-29 ENCOUNTER — Ambulatory Visit (INDEPENDENT_AMBULATORY_CARE_PROVIDER_SITE_OTHER): Payer: 59 | Admitting: Pulmonary Disease

## 2022-08-29 ENCOUNTER — Encounter (HOSPITAL_BASED_OUTPATIENT_CLINIC_OR_DEPARTMENT_OTHER): Payer: Self-pay | Admitting: Pulmonary Disease

## 2022-08-29 VITALS — BP 120/62 | HR 85 | Ht 70.0 in | Wt 196.0 lb

## 2022-08-29 DIAGNOSIS — G4733 Obstructive sleep apnea (adult) (pediatric): Secondary | ICD-10-CM

## 2022-08-29 DIAGNOSIS — Z7189 Other specified counseling: Secondary | ICD-10-CM | POA: Diagnosis not present

## 2022-08-29 NOTE — Patient Instructions (Signed)
Will arrange for new auto CPAP set up  Follow up in 4 months 

## 2022-08-29 NOTE — Progress Notes (Signed)
   [facility]  Chief Complaint  Patient presents with   Follow-up    Needs new cpap machine    Past Surgical History:  He  has a past surgical history that includes Hernia repair; Cholecystectomy; Tonsillectomy; and Kidney stone surgery.  Past Medical History:  HLD, HTN, Nephrolithiasis  Constitutional:  BP 120/62 (BP Location: Left Arm, Cuff Size: Normal)   Pulse 85   Ht 5\' 10"  (1.778 m)   Wt 196 lb (88.9 kg)   SpO2 96%   BMI 28.12 kg/m   Brief Summary:  Samuel Petersen is a 67 y.o. male with obstructive sleep apnea.      Subjective:   His sleep study showed moderate obstructive sleep apnea.  He continues to use his CPAP, but device is old and he needs new supplies.   Physical Exam:   Appearance - well kempt   ENMT - no sinus tenderness, no oral exudate, no LAN, Mallampati 2 airway, no stridor  Respiratory - equal breath sounds bilaterally, no wheezing or rales  CV - s1s2 regular rate and rhythm, no murmurs  Ext - no clubbing, no edema  Skin - no rashes  Psych - normal mood and affect   Sleep Tests:  HST 08/03/22 >> AHI 15.1, SpO2 low 80%   Social History:  He  reports that he has never smoked. He does not have any smokeless tobacco history on file. He reports that he does not drink alcohol and does not use drugs.  Family History:  His family history includes Coronary artery disease in his father; Heart attack in his father; Hyperlipidemia in his brother; Hypertension in his brother; Other in his father; Sarcoidosis in his brother.      Assessment/Plan:   Obstructive sleep apnea. - reviewed sleep study - discussed how sleep apnea can impact his health - discussed treatment options - he would like to continue with CPAP therapy - will arrange for new DME to get a Resmed auto CPAP 5 to 15 cm H2O  Time Spent Involved in Patient Care on Day of Examination:  26 minutes  Follow up:   Patient Instructions   Will arrange for new auto CPAP set up  Follow up in 4 months  Medication List:   Allergies as of 08/29/2022   No Known Allergies      Medication List        Accurate as of August 29, 2022  2:46 PM. If you have any questions, ask your nurse or doctor.          aspirin 81 MG tablet Take 81 mg by mouth daily.   lisinopril 20 MG tablet Commonly known as: ZESTRIL Take 20 mg by mouth daily.   lovastatin 20 MG tablet Commonly known as: MEVACOR Take 20 mg by mouth at bedtime.   meloxicam 15 MG tablet Commonly known as: MOBIC Take 15 mg by mouth daily.   omeprazole 20 MG capsule Commonly known as: PRILOSEC Take 20 mg by mouth every other day.   venlafaxine XR 75 MG 24 hr capsule Commonly known as: EFFEXOR-XR Take 75 mg by mouth daily.        Signature:  Coralyn Helling, MD [facility] Pager - [phone] 08/29/2022, 2:46 PM

## 2023-02-18 ENCOUNTER — Other Ambulatory Visit: Payer: Self-pay

## 2023-02-18 ENCOUNTER — Encounter: Payer: Self-pay | Admitting: Emergency Medicine

## 2023-02-18 ENCOUNTER — Ambulatory Visit
Admission: EM | Admit: 2023-02-18 | Discharge: 2023-02-18 | Disposition: A | Discharge status: Home / Self Care | Payer: Medicare Other | Attending: Nurse Practitioner | Admitting: Nurse Practitioner

## 2023-02-18 DIAGNOSIS — R21 Rash and other nonspecific skin eruption: Secondary | ICD-10-CM | POA: Diagnosis not present

## 2023-02-18 MED ORDER — ONDANSETRON 4 MG PO TBDP: 4.0000 mg | ORAL_TABLET | Freq: Three times a day (TID) | ORAL | 0 refills | Status: AC | PRN

## 2023-02-18 MED ORDER — PREDNISONE 50 MG PO TABS: ORAL_TABLET | ORAL | 0 refills | Status: AC

## 2023-02-18 MED ORDER — TRIAMCINOLONE ACETONIDE 0.1 % EX CREA: 1.0000 | TOPICAL_CREAM | Freq: Two times a day (BID) | CUTANEOUS | 0 refills | Status: AC

## 2023-02-18 MED ORDER — DEXAMETHASONE SODIUM PHOSPHATE 10 MG/ML IJ SOLN
10.0000 mg | INTRAMUSCULAR | Status: AC
  Administered 2023-02-18: 10 mg via INTRAMUSCULAR

## 2023-02-18 NOTE — Discharge Instructions (Addendum)
You have been given an injection of Decadron 10 mg.  If symptoms do not improve with use of triamcinolone cream, begin prednisone.  I have also prescribed Zofran to help with nausea if you begin to take prednisone. May also take over-the-counter Zyrtec during the daytime to help with itching.  Continue Benadryl at bedtime as needed. Avoid hot baths or showers while symptoms persist.  Recommend taking lukewarm baths. May apply cool cloths to the area to help with itching or discomfort. Avoid scratching, rubbing, or manipulating the areas while symptoms persist. Recommend Aveeno colloidal oatmeal bath to use to help with drying and itching. If symptom fail to improve with this treatment, you may follow-up in this clinic or with your primary care physician for further evaluation. Follow-up as needed.

## 2023-02-18 NOTE — ED Provider Notes (Signed)
[facility]    CSN: [id number] Arrival date & time: 02/18/23  1727      History   Chief Complaint Chief Complaint  Patient presents with   Rash    HPI Samuel Petersen is a 68 y.o. male.   The history is provided by the patient and the spouse.   Patient presents with his spouse for complaints of rash that started over the past 24 hours.  Patient states he was out in his yard spraying permethrin to kill Army worms in his lawn.  He states that he was just bringing up in the air.  Rash started shortly afterwards.  Patient states symptoms started on his torso, and have since spread to his generalized body surface area.  Patient states that the rash is "itchy".  He states he has taken Benadryl and used over-the-counter hydrocortisone cream to the affected areas with some relief.  Patient denies exposure to new medications, foods, lotions, soaps, or detergents.  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 Authorizing Provider  ondansetron (ZOFRAN-ODT) 4 MG disintegrating tablet Take 1 tablet (4 mg total) by mouth every 8 (eight) hours as needed. 02/18/23  Yes Yuniel Blaney-Warren, Sadie Haber, NP  predniSONE (DELTASONE) 50 MG tablet Take 1 tablet by mouth daily with breakfast for 5 days. 02/18/23  Yes Rohin Krejci-Warren, Sadie Haber, NP  triamcinolone cream (KENALOG) 0.1 % Apply 1 Application  topically 2 (two) times daily. 02/18/23  Yes Ramey Schiff-Warren, Sadie Haber, NP  aspirin 81 MG tablet Take 81 mg by mouth daily.      [provider]  lisinopril (PRINIVIL,ZESTRIL) 20 MG tablet Take 20 mg by mouth daily.      [provider]  lovastatin (MEVACOR) 20 MG tablet Take 20 mg by mouth at bedtime.    [provider]  meloxicam (MOBIC) 15 MG tablet Take 15 mg by mouth daily.    [provider]  omeprazole (PRILOSEC) 20 MG capsule Take 20 mg by mouth every other day.      [provider]  venlafaxine (EFFEXOR-XR) 75 MG 24 hr capsule Take 75 mg by mouth daily.      [provider]    Family History Family History  Problem Relation Age of Onset   Other Father    Coronary artery disease Father    Heart attack Father    Hyperlipidemia Brother    Hypertension Brother    Sarcoidosis Brother     Social History Social History   Tobacco Use   Smoking status: Never  Substance Use Topics   Alcohol use: No   Drug use: No     Allergies   Patient has no known allergies.   Review of Systems Review of Systems Per HPI  Physical Exam Triage Vital Signs  ED Triage Vitals  Encounter Vitals Group     BP 02/18/23 1810 115/76     Systolic BP Percentile --      Diastolic BP Percentile --      Pulse Rate 02/18/23 1810 77     Resp 02/18/23 1810 20     Temp 02/18/23 1810 97.8 F (36.6 C)     Temp Source 02/18/23 1810 Oral     SpO2 02/18/23 1810 96 %     Weight --      Height --      Head Circumference --      Peak Flow --      Pain Score 02/18/23 1809 0     Pain Loc --      Pain Education --      Exclude from Growth Chart --    No data found.  Updated Vital Signs BP 115/76 (BP Location: Right Arm)   Pulse 77   Temp 97.8 F (36.6 C) (Oral)   Resp 20   SpO2 96%   Visual Acuity Right Eye Distance:   Left Eye Distance:   Bilateral Distance:    Right Eye Near:   Left Eye Near:    Bilateral Near:     Physical  Exam Vitals and nursing note reviewed.  Constitutional:      General: He is not in acute distress.    Appearance: Normal appearance.  HENT:     Head: Normocephalic.  Eyes:     Extraocular Movements: Extraocular movements intact.     Pupils: Pupils are equal, round, and reactive to light.  Cardiovascular:     Rate and Rhythm: Regular rhythm.     Pulses: Normal pulses.     Heart sounds: Normal heart sounds.  Pulmonary:     Effort: Pulmonary effort is normal.  Musculoskeletal:     Cervical back: Normal range of motion.  Lymphadenopathy:     Cervical: No cervical adenopathy.  Skin:    General: Skin is warm and dry.     Findings: Rash present. Rash is urticarial.     Comments: Hives noted to generalized body surface area.   Neurological:     General: No focal deficit present.     Mental Status: He is alert and oriented to person, place, and time.  Psychiatric:        Mood and Affect: Mood normal.        Behavior: Behavior normal.      UC Treatments / Results  Labs (all labs ordered are listed, but only abnormal results are displayed) Labs Reviewed - No data to display  EKG   Radiology No results found.  Procedures Procedures (including critical care time)  Medications Ordered in UC Medications  dexamethasone (DECADRON) injection 10 mg (has no administration in time range)    Initial Impression / Assessment and Plan / UC Course  I have reviewed the triage vital signs and the nursing notes.  Pertinent labs & imaging results that were available during my care of the patient were reviewed by me and considered in my medical decision making (see chart for details).  The patient is well-appearing, he is in no acute distress, vital signs are stable.  Patient with hives to generalized body surface area.  Symptoms appear to be consistent with some type of "contact".  Based on the patient's history, the only new substance was the permethrin he was spraying for insects on  his lawn.  Decadron 10 mg IM was administered  for itching and rash.  Prednisone 50 mg for the next 5 days prescribed.  Patient reports that he does develop nausea with prednisone, Zofran 4 mg ODT was prescribed to offset the nausea.  Triamcinolone cream 0.1% was also prescribed to apply directly to the rash for itching.  Patient would like to start with cream prior to beginning use of prednisone.  Supportive care recommendations were provided and discussed with the patient and his spouse to include continuing use of Benadryl, cool compresses to the affected area, and avoidance of hot baths or showers.  Patient was advised to follow-up if symptoms do not improve with this treatment.  Patient is in agreement with this plan of care and verbalizes understanding.  All questions were answered.  Patient stable for discharge.  Final Clinical Impressions(s) / UC Diagnoses   Final diagnoses:  Rash and nonspecific skin eruption     Discharge Instructions      You have been given an injection of Decadron 10 mg.  If symptoms do not improve with use of triamcinolone cream, begin prednisone.  I have also prescribed Zofran to help with nausea if you begin to take prednisone. May also take over-the-counter Zyrtec during the daytime to help with itching.  Continue Benadryl at bedtime as needed. Avoid hot baths or showers while symptoms persist.  Recommend taking lukewarm baths. May apply cool cloths to the area to help with itching or discomfort. Avoid scratching, rubbing, or manipulating the areas while symptoms persist. Recommend Aveeno colloidal oatmeal bath to use to help with drying and itching. If symptom fail to improve with this treatment, you may follow-up in this clinic or with your primary care physician for further evaluation. Follow-up as needed.     ED Prescriptions     Medication Sig Dispense Auth. Provider   predniSONE (DELTASONE) 50 MG tablet Take 1 tablet by mouth daily with breakfast for  5 days. 5 tablet Layloni Fahrner-Warren, Sadie Haber, NP   triamcinolone cream (KENALOG) 0.1 % Apply 1 Application topically 2 (two) times daily. 453.6 g Traquan Duarte-Warren, Sadie Haber, NP   ondansetron (ZOFRAN-ODT) 4 MG disintegrating tablet Take 1 tablet (4 mg total) by mouth every 8 (eight) hours as needed. 20 tablet Lexxi Koslow-Warren, Sadie Haber, NP      PDMP not reviewed this encounter.   Abran Cantor, NP 02/18/23 [phone]

## 2023-02-18 NOTE — ED Triage Notes (Addendum)
Pt reports generalized "rash" since yesterday. Pt denies any new self care products. Pt reports site itches but denies pain. Has tried otc medication and creams.   Hive-like redness noted to torso, red raised blister like bumps noted to bilateral arms.airway patent. NAD noted.

## 2023-02-19 ENCOUNTER — Telehealth: Payer: Self-pay | Admitting: Pulmonary Disease

## 2023-02-19 NOTE — Telephone Encounter (Signed)
[facility] is calling for medical paper work to be signed and faxed back. It was sent on the 23rd. They will refax. Please advise.Fax number [phone]

## 2023-02-26 NOTE — Telephone Encounter (Signed)
Do you have CMN on this pt?

## 2023-04-14 NOTE — Telephone Encounter (Signed)
[facility] calling again to check status, re faxing to [phone] atten Rubye Oaks, NP
